# Patient Record
Sex: Female | Born: 1980 | Race: White | Hispanic: No | Smoking: Never smoker
Health system: Southern US, Community
[De-identification: ages and names within clinical notes are randomized; demographics above are authoritative.]

## PROBLEM LIST (undated history)

## (undated) HISTORY — PX: NO PAST SURGERIES: SHX2092

## (undated) HISTORY — PX: WISDOM TOOTH EXTRACTION: SHX21

---
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
## 2002-06-12 ENCOUNTER — Other Ambulatory Visit: Admission: RE | Admit: 2002-06-12 | Discharge: 2002-06-12 | Payer: Self-pay | Admitting: Internal Medicine

## 2003-10-12 ENCOUNTER — Other Ambulatory Visit: Admission: RE | Admit: 2003-10-12 | Discharge: 2003-10-12 | Payer: Self-pay | Admitting: Internal Medicine

## 2004-10-31 ENCOUNTER — Ambulatory Visit: Payer: Self-pay | Admitting: Internal Medicine

## 2004-10-31 ENCOUNTER — Other Ambulatory Visit: Admission: RE | Admit: 2004-10-31 | Discharge: 2004-10-31 | Payer: Self-pay | Admitting: Internal Medicine

## 2010-08-01 ENCOUNTER — Other Ambulatory Visit: Payer: Self-pay | Admitting: Obstetrics and Gynecology

## 2010-11-13 ENCOUNTER — Other Ambulatory Visit: Payer: Self-pay | Admitting: Family Medicine

## 2010-11-13 DIAGNOSIS — R1011 Right upper quadrant pain: Secondary | ICD-10-CM

## 2010-11-13 DIAGNOSIS — R1031 Right lower quadrant pain: Secondary | ICD-10-CM

## 2010-11-14 ENCOUNTER — Ambulatory Visit
Admission: RE | Admit: 2010-11-14 | Discharge: 2010-11-14 | Disposition: A | Discharge status: Home / Self Care | Payer: 59 | Source: Ambulatory Visit | Attending: Family Medicine | Admitting: Family Medicine

## 2010-11-14 DIAGNOSIS — R1031 Right lower quadrant pain: Secondary | ICD-10-CM

## 2010-11-14 DIAGNOSIS — R1011 Right upper quadrant pain: Secondary | ICD-10-CM

## 2010-11-14 MED ORDER — IOHEXOL 300 MG/ML  SOLN
100.0000 mL | Freq: Once | INTRAMUSCULAR | Status: AC | PRN
  Administered 2010-11-14: 100 mL via INTRAVENOUS

## 2011-01-22 LAB — OB RESULTS CONSOLE HEPATITIS B SURFACE ANTIGEN: Hepatitis B Surface Ag: NEGATIVE

## 2011-01-22 LAB — OB RESULTS CONSOLE RUBELLA ANTIBODY, IGM: Rubella: IMMUNE

## 2011-04-21 NOTE — L&D Delivery Note (Signed)
Delivery Note At 4:27 AM a viable female was delivered via Vaginal, Spontaneous Delivery (Presentation: Right Occiput Anterior).  APGAR: 8, 9; weight .   Placenta status: Intact, Spontaneous.  Cord: 3 vessels with the following complications: None.  Cord pH: not indicated  Anesthesia: Epidural  Episiotomy: None Lacerations: None;2nd degree;Perineal, b/l anterior splayed abrasions Suture Repair: 3.0 vicryl rapide 4.0 vicryl rapide used for a single figure of 8 to reapproximate splayed area anteriorly Est. Blood Loss (mL):   Mom to postpartum.  Baby to nursery-stable.  Rhyan Wolters A. 08/13/2011, 4:51 AM

## 2011-05-21 LAB — OB RESULTS CONSOLE ABO/RH: RH Type: NEGATIVE

## 2011-05-21 LAB — OB RESULTS CONSOLE GC/CHLAMYDIA: Gonorrhea: NEGATIVE

## 2011-07-15 LAB — OB RESULTS CONSOLE GBS: GBS: NEGATIVE

## 2011-08-12 ENCOUNTER — Inpatient Hospital Stay (HOSPITAL_COMMUNITY)
Admission: AD | Admit: 2011-08-12 | Discharge: 2011-08-15 | DRG: 775 | Disposition: A | Discharge status: Home / Self Care | Payer: 59 | Source: Ambulatory Visit | Attending: Obstetrics | Admitting: Obstetrics

## 2011-08-12 ENCOUNTER — Inpatient Hospital Stay (HOSPITAL_COMMUNITY): Payer: 59 | Admitting: Anesthesiology

## 2011-08-12 ENCOUNTER — Encounter (HOSPITAL_COMMUNITY): Payer: Self-pay | Admitting: *Deleted

## 2011-08-12 ENCOUNTER — Encounter (HOSPITAL_COMMUNITY): Payer: Self-pay | Admitting: Anesthesiology

## 2011-08-12 LAB — CBC
Hemoglobin: 13 g/dL (ref 12.0–15.0)
MCH: 31.2 pg (ref 26.0–34.0)
MCHC: 34.3 g/dL (ref 30.0–36.0)
RDW: 14.6 % (ref 11.5–15.5)

## 2011-08-12 MED ORDER — PHENYLEPHRINE 40 MCG/ML (10ML) SYRINGE FOR IV PUSH (FOR BLOOD PRESSURE SUPPORT)
80.0000 ug | PREFILLED_SYRINGE | INTRAVENOUS | Status: DC | PRN
  Filled 2011-08-12: qty 5

## 2011-08-12 MED ORDER — EPHEDRINE 5 MG/ML INJ
10.0000 mg | INTRAVENOUS | Status: DC | PRN
  Filled 2011-08-12: qty 4

## 2011-08-12 MED ORDER — LACTATED RINGERS IV SOLN
500.0000 mL | INTRAVENOUS | Status: DC | PRN
Start: 2011-08-12 — End: 2011-08-13

## 2011-08-12 MED ORDER — LACTATED RINGERS IV SOLN: 500.0000 mL | Freq: Once | INTRAVENOUS | Status: DC

## 2011-08-12 MED ORDER — OXYTOCIN BOLUS FROM INFUSION
500.0000 mL | Freq: Once | INTRAVENOUS | Status: DC
  Filled 2011-08-12: qty 1000
  Filled 2011-08-12: qty 500

## 2011-08-12 MED ORDER — LIDOCAINE HCL (PF) 1 % IJ SOLN
30.0000 mL | INTRAMUSCULAR | Status: DC | PRN
  Filled 2011-08-12: qty 30

## 2011-08-12 MED ORDER — PHENYLEPHRINE 40 MCG/ML (10ML) SYRINGE FOR IV PUSH (FOR BLOOD PRESSURE SUPPORT): 80.0000 ug | PREFILLED_SYRINGE | INTRAVENOUS | Status: DC | PRN

## 2011-08-12 MED ORDER — OXYCODONE-ACETAMINOPHEN 5-325 MG PO TABS: 1.0000 | ORAL_TABLET | ORAL | Status: DC | PRN

## 2011-08-12 MED ORDER — CITRIC ACID-SODIUM CITRATE 334-500 MG/5ML PO SOLN: 30.0000 mL | ORAL | Status: DC | PRN

## 2011-08-12 MED ORDER — ONDANSETRON HCL 4 MG/2ML IJ SOLN: 4.0000 mg | Freq: Four times a day (QID) | INTRAMUSCULAR | Status: DC | PRN

## 2011-08-12 MED ORDER — ACETAMINOPHEN 325 MG PO TABS: 650.0000 mg | ORAL_TABLET | ORAL | Status: DC | PRN

## 2011-08-12 MED ORDER — FLEET ENEMA 7-19 GM/118ML RE ENEM: 1.0000 | ENEMA | RECTAL | Status: DC | PRN

## 2011-08-12 MED ORDER — EPHEDRINE 5 MG/ML INJ: 10.0000 mg | INTRAVENOUS | Status: DC | PRN

## 2011-08-12 MED ORDER — DIPHENHYDRAMINE HCL 50 MG/ML IJ SOLN: 12.5000 mg | INTRAMUSCULAR | Status: DC | PRN

## 2011-08-12 MED ORDER — LACTATED RINGERS IV SOLN
INTRAVENOUS | Status: DC
  Administered 2011-08-12: 23:00:00 via INTRAVENOUS

## 2011-08-12 MED ORDER — OXYTOCIN 20 UNITS IN LACTATED RINGERS INFUSION - SIMPLE: 125.0000 mL/h | Freq: Once | INTRAVENOUS | Status: DC

## 2011-08-12 MED ORDER — IBUPROFEN 600 MG PO TABS
600.0000 mg | ORAL_TABLET | Freq: Four times a day (QID) | ORAL | Status: DC | PRN
  Administered 2011-08-13: 600 mg via ORAL

## 2011-08-12 MED ORDER — FENTANYL 2.5 MCG/ML BUPIVACAINE 1/10 % EPIDURAL INFUSION (WH - ANES)
14.0000 mL/h | INTRAMUSCULAR | Status: DC
  Administered 2011-08-13 (×2): 14 mL/h via EPIDURAL
  Filled 2011-08-12 (×2): qty 60

## 2011-08-12 NOTE — MAU Note (Signed)
Pt G1 at 39wks, leaking clear fluid since 1810 and having contractions.  Denies any problems with pregnancy.

## 2011-08-12 NOTE — Anesthesia Procedure Notes (Signed)
Epidural Patient location during procedure: OB Start time: 08/12/2011 11:51 PM Reason for block: procedure for pain  Staffing Performed by: anesthesiologist   Preanesthetic Checklist Completed: patient identified, site marked, surgical consent, pre-op evaluation, timeout performed, IV checked, risks and benefits discussed and monitors and equipment checked  Epidural Patient position: sitting Prep: site prepped and draped and DuraPrep Patient monitoring: continuous pulse ox and blood pressure Approach: midline Injection technique: LOR air  Needle:  Needle type: Tuohy  Needle gauge: 17 G Needle length: 9 cm Needle insertion depth: 4 cm Catheter type: closed end flexible Catheter size: 19 Gauge Catheter at skin depth: 9 cm Test dose: negative  Assessment Events: blood not aspirated, injection not painful, no injection resistance, negative IV test and no paresthesia  Additional Notes Discussed risk of headache, infection, bleeding, nerve injury and failed or incomplete block.  Patient voices understanding and wishes to proceed.

## 2011-08-12 NOTE — Anesthesia Preprocedure Evaluation (Signed)
Anesthesia Evaluation  Patient identified by MRN, date of birth, ID band Patient awake    Reviewed: Allergy & Precautions, H&P , NPO status , Patient's Chart, lab work & pertinent test results, reviewed documented beta blocker date and time   History of Anesthesia Complications Negative for: history of anesthetic complications  Airway Mallampati: I TM Distance: >3 FB Neck ROM: full    Dental  (+) Teeth Intact   Pulmonary neg pulmonary ROS,  breath sounds clear to auscultation        Cardiovascular negative cardio ROS  Rhythm:regular Rate:Normal     Neuro/Psych negative neurological ROS  negative psych ROS   GI/Hepatic negative GI ROS, Neg liver ROS,   Endo/Other  negative endocrine ROS  Renal/GU negative Renal ROS  negative genitourinary   Musculoskeletal   Abdominal   Peds  Hematology negative hematology ROS (+)   Anesthesia Other Findings   Reproductive/Obstetrics (+) Pregnancy                           Anesthesia Physical Anesthesia Plan  ASA: II  Anesthesia Plan: Epidural   Post-op Pain Management:    Induction:   Airway Management Planned:   Additional Equipment:   Intra-op Plan:   Post-operative Plan:   Informed Consent: I have reviewed the patients History and Physical, chart, labs and discussed the procedure including the risks, benefits and alternatives for the proposed anesthesia with the patient or authorized representative who has indicated his/her understanding and acceptance.     Plan Discussed with:   Anesthesia Plan Comments:         Anesthesia Quick Evaluation  

## 2011-08-13 ENCOUNTER — Encounter (HOSPITAL_COMMUNITY): Payer: Self-pay | Admitting: *Deleted

## 2011-08-13 LAB — ABO/RH: ABO/RH(D): O NEG

## 2011-08-13 LAB — RPR: RPR Ser Ql: NONREACTIVE

## 2011-08-13 MED ORDER — ZOLPIDEM TARTRATE 5 MG PO TABS: 5.0000 mg | ORAL_TABLET | Freq: Every evening | ORAL | Status: DC | PRN

## 2011-08-13 MED ORDER — PRENATAL MULTIVITAMIN CH
1.0000 | ORAL_TABLET | Freq: Every day | ORAL | Status: DC
  Administered 2011-08-13 – 2011-08-15 (×3): 1 via ORAL
  Filled 2011-08-13 (×3): qty 1

## 2011-08-13 MED ORDER — DIPHENHYDRAMINE HCL 25 MG PO CAPS
25.0000 mg | ORAL_CAPSULE | Freq: Four times a day (QID) | ORAL | Status: DC | PRN
Start: 2011-08-13 — End: 2011-08-15

## 2011-08-13 MED ORDER — ONDANSETRON HCL 4 MG PO TABS: 4.0000 mg | ORAL_TABLET | ORAL | Status: DC | PRN

## 2011-08-13 MED ORDER — BISACODYL 10 MG RE SUPP: 10.0000 mg | Freq: Every day | RECTAL | Status: DC | PRN

## 2011-08-13 MED ORDER — SIMETHICONE 80 MG PO CHEW: 80.0000 mg | CHEWABLE_TABLET | ORAL | Status: DC | PRN

## 2011-08-13 MED ORDER — FLEET ENEMA 7-19 GM/118ML RE ENEM: 1.0000 | ENEMA | Freq: Every day | RECTAL | Status: DC | PRN

## 2011-08-13 MED ORDER — LIDOCAINE HCL (PF) 1 % IJ SOLN
INTRAMUSCULAR | Status: DC | PRN
  Administered 2011-08-12 (×3): 4 mL

## 2011-08-13 MED ORDER — ONDANSETRON HCL 4 MG/2ML IJ SOLN: 4.0000 mg | INTRAMUSCULAR | Status: DC | PRN

## 2011-08-13 MED ORDER — SENNOSIDES-DOCUSATE SODIUM 8.6-50 MG PO TABS
2.0000 | ORAL_TABLET | Freq: Every day | ORAL | Status: DC
  Administered 2011-08-14: 2 via ORAL

## 2011-08-13 MED ORDER — WITCH HAZEL-GLYCERIN EX PADS: 1.0000 "application " | MEDICATED_PAD | CUTANEOUS | Status: DC | PRN

## 2011-08-13 MED ORDER — DIBUCAINE 1 % RE OINT: 1.0000 "application " | TOPICAL_OINTMENT | RECTAL | Status: DC | PRN

## 2011-08-13 MED ORDER — BENZOCAINE-MENTHOL 20-0.5 % EX AERO
1.0000 "application " | INHALATION_SPRAY | CUTANEOUS | Status: DC | PRN
  Administered 2011-08-13: 1 via TOPICAL
  Filled 2011-08-13 (×2): qty 56

## 2011-08-13 MED ORDER — LANOLIN HYDROUS EX OINT: TOPICAL_OINTMENT | CUTANEOUS | Status: DC | PRN

## 2011-08-13 MED ORDER — TETANUS-DIPHTH-ACELL PERTUSSIS 5-2.5-18.5 LF-MCG/0.5 IM SUSP
0.5000 mL | Freq: Once | INTRAMUSCULAR | Status: AC
  Administered 2011-08-14: 0.5 mL via INTRAMUSCULAR
  Filled 2011-08-13: qty 0.5

## 2011-08-13 MED ORDER — IBUPROFEN 600 MG PO TABS
600.0000 mg | ORAL_TABLET | Freq: Four times a day (QID) | ORAL | Status: DC
  Administered 2011-08-13 – 2011-08-15 (×9): 600 mg via ORAL
  Filled 2011-08-13 (×10): qty 1

## 2011-08-13 MED ORDER — OXYCODONE-ACETAMINOPHEN 5-325 MG PO TABS: 1.0000 | ORAL_TABLET | ORAL | Status: DC | PRN

## 2011-08-13 NOTE — Anesthesia Postprocedure Evaluation (Signed)
  Anesthesia Post-op Note  Patient: Brooke Dillon  Procedure(s) Performed: * No surgery found *  Patient Location: Mother/Baby  Anesthesia Type: Epidural  Level of Consciousness: awake  Airway and Oxygen Therapy: Patient Spontanous Breathing  Post-op Pain: none  Post-op Assessment: Patient's Cardiovascular Status Stable, Respiratory Function Stable, Patent Airway, No signs of Nausea or vomiting, Adequate PO intake, Pain level controlled, No headache, No backache, No residual numbness and No residual motor weakness  Post-op Vital Signs: Reviewed and stable  Complications: No apparent anesthesia complications

## 2011-08-13 NOTE — Progress Notes (Signed)
Patient ID: Brooke Dillon, female   DOB: 01-25-1981, 31 y.o.   MRN: [id number] PPD # 0  Subjective: Pt reports feeling well/ Pain controlled with prescription NSAID's including ibuprofen (Motrin) Tolerating po/ Voiding without problems/ No n/v Bleeding is moderate Newborn info:  Information for the patient's newborn:  Brooke Dillon [[id number]]  female  circ planned/ Feeding: breast   Objective:  VS: Blood pressure 109/70, pulse 98, temperature 99 F (37.2 C), temperature source Oral, resp. rate 18, height 5\' 2"  (1.575 m), weight 66.679 kg (147 lb), SpO2 100.00%, unknown if currently breastfeeding.    Basename 08/12/11 2241  WBC 14.6*  HGB 13.0  HCT 37.9  PLT 225    Blood type: --/--/O NEG (04/24 2241) Rubella: Immune (10/04 0000)    Physical Exam:  General: A & O x 3  alert, cooperative and no distress CV: Regular rate and rhythm Resp: clear Abdomen: soft, nontender, normal bowel sounds Uterine Fundus: firm, below umbilicus, nontender Perineum: with slight edema Lochia: moderate Ext: no edema, no evidence of a DVT   A/P: PPD # 1/ G1P1001/ S/P:spontaneous vaginal with 2nd degree laceration Doing well Continue routine post partum orders Anticipate D/C home in AM at pt request    [facility], [facility] 08/13/2011, 10:26 AM

## 2011-08-13 NOTE — H&P (Signed)
Brooke Dillon is a 31 y.o. G1P0 at [redacted]w[redacted]d presenting for SROM. Pt notes onset contractions about 1 hr after SROM . Good fetal movement, No vaginal bleeding other than spotting w/ wiping;  leaking fluid since 6p tonight.  [facility] at [facility] since 8 wks - early radiation exposure (CT abdomen in 1st trimester) - unplanned but desired pregnancy - low lying placenta w/ resolution  OB History    Grav Para Term Preterm Abortions TAB SAB Ect Mult Living   1              History reviewed. No pertinent past medical history. Past Surgical History  Procedure Date  . Wisdom tooth extraction   . No past surgeries    Family History: family history includes Hypertension in her mother.  There is no history of Anesthesia problems, and Hypotension, and Malignant hyperthermia, and Pseudochol deficiency, . Social History:  reports that she has never smoked. She has never used smokeless tobacco. She reports that she does not drink alcohol or use illicit drugs.  Review of Systems - Negative except contraction   Dilation: 4 Effacement (%): 80 Station: -1 Exam by:: H.Norton, RN  Blood pressure 123/76, pulse 112, temperature 98.4 F (36.9 C), temperature source Oral, resp. rate 18, height 5\' 2"  (1.575 m), weight 66.679 kg (147 lb), SpO2 100.00%.  Physical Exam: Gen: well appearing, no distress (after epidural) CV: RRR Pulm: CTAB Back: no CVAT Abd: gravid, NT, no RUQ pain, EFW 8# LE: no edema, equal bilaterally, non-tender Toco: q 3-4 min FH: baseline 140s, accelerations present, no deceleratons, 10 beat variability  Prenatal labs: ABO, Rh: O/Negative/-- (01/31 0000) Antibody: Negative (01/31 0000) Rubella:  immune RPR: Nonreactive (01/31 0000)  HBsAg: Negative (10/04 0000)  HIV: Non-reactive (01/31 0000)  GBS: Negative (03/27 0000)  1 hr Glucola nl  Genetic screening nl NT, nl AFP Anatomy US nl CF testing neg carrier status   Assessment/Plan: 31 y.o. G1P0 at [redacted]w[redacted]d ROM then onset of  active labor, good cervical change, cont expectant management Reactive fetal testing Epidural Rh neg, eval PP need for Rhogam   Dayten Juba A. 08/13/2011, 12:49 AM

## 2011-08-14 LAB — CBC
HCT: 35.2 % — ABNORMAL LOW (ref 36.0–46.0)
Hemoglobin: 11.6 g/dL — ABNORMAL LOW (ref 12.0–15.0)
MCHC: 33 g/dL (ref 30.0–36.0)
RBC: 3.74 MIL/uL — ABNORMAL LOW (ref 3.87–5.11)

## 2011-08-14 MED ORDER — RHO D IMMUNE GLOBULIN 1500 UNIT/2ML IJ SOLN
300.0000 ug | Freq: Once | INTRAMUSCULAR | Status: AC
  Administered 2011-08-14: 300 ug via INTRAMUSCULAR
  Filled 2011-08-14: qty 2

## 2011-08-14 NOTE — Progress Notes (Signed)
Patient ID: Brooke Dillon, female   DOB: Jul 10, 1980, 31 y.o.   MRN: [id number] PPD # 1  Subjective: Pt reports feeling well, but slightly sore perineal area/ Pain controlled with prescription NSAID's including ibuprofen (Motrin) Pt also concerned as the baby has not been breastfeeding very well-not ready to go home today as discussed yesterday Tolerating po/ Voiding without problems/ No n/v Bleeding is light Newborn info:  Information for the patient's newborn:  Brooke Dillon Boy Niomie [[id number]]  female circ planned/ Feeding: breast   Objective:  VS: Blood pressure 109/73, pulse 77, temperature 97.5 F (36.4 C), temperature source Oral, resp. rate 18, height 5\' 2"  (1.575 m), weight 66.679 kg (147 lb), SpO2 98.00%, unknown if currently breastfeeding.    Basename 08/14/11 0540 08/12/11 2241  WBC 12.0* 14.6*  HGB 11.6* 13.0  HCT 35.2* 37.9  PLT 192 225    Blood type: --/--/O NEG (04/26 0540) Rubella: Immune (10/04 0000)    Physical Exam:  General: A & O x 3  alert, cooperative and no distress CV: Regular rate and rhythm Resp: clear Abdomen: soft, nontender, normal bowel sounds Uterine Fundus: firm, below umbilicus(U/1), nontender  Perineum: some slight bruising noted in the right lower labial area Lochia: minimal Ext: extremities normal, atraumatic, no cyanosis or edema   A/P: PPD # 1/ G1P1001/ S/P:SVD with 2nd degree laceration Lactation already called this am for consultation Doing well Continue routine post partum orders Anticipate D/C home in AM    [facility] 08/14/2011, 8:29 AM

## 2011-08-15 LAB — RH IG WORKUP (INCLUDES ABO/RH)
ABO/RH(D): O NEG
Antibody Screen: NEGATIVE
Fetal Screen: NEGATIVE
Gestational Age(Wks): 39.1

## 2011-08-15 MED ORDER — IBUPROFEN 800 MG PO TABS: 800.0000 mg | ORAL_TABLET | Freq: Three times a day (TID) | ORAL | Status: DC | PRN

## 2011-08-15 NOTE — Progress Notes (Signed)
Patient ID: Gerardo Territo, female   DOB: 12/09/1980, 32 y.o.   MRN: [id number]  PPD 2 SVD  S:  Reports feeling well             Tolerating po/ No nausea or vomiting             Bleeding is light             Pain controlled with motrin only             Up ad lib / ambulatory  Newborn breast feeding     O:  A & O x 3 NAD             VS: Blood pressure 112/73, pulse 75, temperature 97.9 F (36.6 C), temperature source Oral, resp. rate 20, height 5\' 2"  (1.575 m), weight 66.679 kg (147 lb), SpO2 99.00%, unknown if currently breastfeeding.  Lungs: Clear and unlabored  Heart: regular rate and rhythm / no mumurs  Abdomen: soft, non-tender, non-distended              Fundus: firm, non-tender, U-1  Perineum: no edema  Lochia: light  Extremities: no edema, no calf pain or tenderness    A: PPD # 2   Doing well - stable status  P:  Routine post partum orders  Discharge home  Marlinda Mike 08/15/2011, 10:40 AM

## 2011-08-15 NOTE — Discharge Summary (Signed)
Obstetric Discharge Summary Reason for Admission: onset of labor Prenatal Procedures: none Intrapartum Procedures: spontaneous vaginal delivery Postpartum Procedures: none Complications-Operative and Postpartum: 2nd degree perineal laceration Hemoglobin  Date Value Range Status  08/14/2011 11.6* 12.0-15.0 (g/dL) Final     HCT  Date Value Range Status  08/14/2011 35.2* 36.0-46.0 (%) Final    Physical Exam:  General: alert, cooperative and no distress Lochia: appropriate Uterine Fundus: firm Incision: healing well DVT Evaluation: No evidence of DVT seen on physical exam.  Discharge Diagnoses: Term Pregnancy-delivered  Discharge Information: Date: 08/15/2011 Activity: pelvic rest Diet: routine Medications: PNV and Ibuprofen Condition: stable Instructions: refer to practice specific booklet Discharge to: home Follow-up Information    Schedule an appointment as soon as possible for a visit in 6 weeks to follow up.         Newborn Data: Live born female  Birth Weight: 7 lb 9.7 oz (3450 g) APGAR: 8, 9  Brooke Dillon 08/15/2011, 10:42 AM

## 2011-08-17 ENCOUNTER — Ambulatory Visit (HOSPITAL_COMMUNITY)
Admit: 2011-08-17 | Discharge: 2011-08-17 | Disposition: A | Discharge status: Home / Self Care | Payer: 59 | Attending: Obstetrics | Admitting: Obstetrics

## 2011-08-17 NOTE — Progress Notes (Signed)
Infant Lactation Consultation Outpatient Visit Note Reason for consult: difficult latch/using nipple shield  Patient Name: Brooke Dillon  Baby name: Aiden Amanda Cockayne Date of Birth: 08-Jul-1980             DOB: [birth year] Birth Weight:                                  7 lbs. 9 oz. Gestational Age at Delivery: Gestational Age: <None>  39 weeks Type of Delivery:  Vaginal Delivery  Breastfeeding History Frequency of Breastfeeding:  2-3 hrs Length of Feeding: 20 mins Voids: 6 Stools: 8 yellow seedy (changed 4/28)    Comments: Aiden weighs 7 lbs. 3 oz today at day 4.  This is up from 7 lbs. 1 oz on discharge 08/15/11.  Nipple shield initiated in the hospital due to difficult latch and semi flat nipples, and dense areolar tissue.  Milk began coming in yesterday at home, and Aiden began nursing more vigorously and swallowing heard, stools changed to yellow/seedy, from the dark meconium.  Baby appears healthy, alert, well hydrated with >6 wets and 8 yellow stools per day.    Mom latched Aiden easily in cross cradle hold.  Adjusted her hand placement to encourage a deeper latch.  Encouraged alternating breast massage during feedings to increase milk transfer.  Baby fed nutritive sucking for 20 mins on right, and >20 mins on left.  Left nipple with some old blistered noted.  Assisted [name] to try to latch Aiden without the nipple shield, but was unsuccessful.  Recommended she make an appointment later when she has had a little luck on her own at home.  PC weight gain from both breasts 50 ml.  Praised Mom and Dad for doing such a wonderful job with Aiden.  Recommended skin to skin, frequent breast feedings, and rest for the next few days.    Follow-up with pediatrician 08/19/11 weight check, and [facility] RN on 08/21/11.  To call our office prn.     [name] 08/17/2011, 11:09 AM

## 2011-08-23 ENCOUNTER — Inpatient Hospital Stay (HOSPITAL_COMMUNITY)
Admission: AD | Admit: 2011-08-23 | Discharge: 2011-08-23 | Disposition: A | Discharge status: Home / Self Care | Payer: 59 | Source: Ambulatory Visit | Attending: Obstetrics and Gynecology | Admitting: Obstetrics and Gynecology

## 2011-08-23 ENCOUNTER — Inpatient Hospital Stay (HOSPITAL_COMMUNITY): Payer: 59

## 2011-08-23 LAB — CBC
HCT: 38.5 % (ref 36.0–46.0)
Hemoglobin: 12.8 g/dL (ref 12.0–15.0)
MCH: 30.7 pg (ref 26.0–34.0)
MCHC: 33.2 g/dL (ref 30.0–36.0)
MCV: 92.3 fL (ref 78.0–100.0)
RDW: 13.5 % (ref 11.5–15.5)

## 2011-08-23 MED ORDER — METHYLERGONOVINE MALEATE 0.2 MG PO TABS: 0.2000 mg | ORAL_TABLET | Freq: Four times a day (QID) | ORAL | Status: AC

## 2011-08-23 MED ORDER — LACTATED RINGERS IV SOLN
Freq: Once | INTRAVENOUS | Status: AC
  Administered 2011-08-23: 04:00:00 via INTRAVENOUS

## 2011-08-23 MED ORDER — METHYLERGONOVINE MALEATE 0.2 MG/ML IJ SOLN
0.2000 mg | Freq: Once | INTRAMUSCULAR | Status: AC
  Administered 2011-08-23: 0.2 mg via INTRAMUSCULAR

## 2011-08-23 MED ORDER — METHYLERGONOVINE MALEATE 0.2 MG/ML IJ SOLN
INTRAMUSCULAR | Status: AC
  Filled 2011-08-23: qty 1

## 2011-08-23 NOTE — MAU Note (Signed)
Patient is in with c/o sudden onset of heavy vaginal bleeding with clots. The pad that she came in with was completed saturated and bled through her pants, medium size clot on underwear. pericare given, big blue pad given as well. She states that she had a normal vaginal delivery on 4/25 without any bleeding complications. She is c/o dizziness. Denies pain. She states that she took 800mg  ibuprophen about ago.

## 2011-08-23 NOTE — MAU Provider Note (Signed)
History     CSN: [id number]  Arrival date and time: 08/23/11 4098   First Provider Initiated Contact with Patient 08/23/11 0431      Chief Complaint  Patient presents with  . Vaginal Bleeding   HPI Comments: S/P uncomplicated SVB 4/25, G1P1001, currently breastfeeding.  Vaginal Bleeding The patient's primary symptoms include vaginal bleeding (started about 1 hour ago, reports brisk bleed, soaked through hre pad and clothes when coming in). This is a new problem. The current episode started today. The patient is experiencing no pain. She is not pregnant. Associated symptoms include nausea (earlier at home, denies at present). Pertinent negatives include no abdominal pain, chills or fever. She has been passing clots. She has not been passing tissue. She has tried NSAIDs (x1 800 mg at home prior to coming to hospital) for the symptoms. The treatment provided no relief.    OB History    Grav Para Term Preterm Abortions TAB SAB Ect Mult Living   1 1 1       1       Past Medical History  Diagnosis Date  . PP care - s/p NVB 4/25 08/13/2011    Past Surgical History  Procedure Date  . Wisdom tooth extraction   . No past surgeries     Family History  Problem Relation Age of Onset  . Hypertension Mother   . Anesthesia problems Neg Hx   . Hypotension Neg Hx   . Malignant hyperthermia Neg Hx   . Pseudochol deficiency Neg Hx     History  Substance Use Topics  . Smoking status: Never Smoker   . Smokeless tobacco: Never Used  . Alcohol Use: No    Allergies: No Known Allergies  Prescriptions prior to admission  Medication Sig Dispense Refill  . cetirizine (ZYRTEC) 10 MG tablet Take 10 mg by mouth daily.      Marland Kitchen ibuprofen (ADVIL,MOTRIN) 800 MG tablet Take 1 tablet (800 mg total) by mouth every 8 (eight) hours as needed for pain.  30 tablet  0  . Prenatal Vit-Fe Fumarate-FA (PRENATAL MULTIVITAMIN) TABS Take 1 tablet by mouth daily.        Review of Systems  Constitutional:  Negative for fever and chills.  Gastrointestinal: Positive for nausea (earlier at home, denies at present). Negative for abdominal pain.  Genitourinary: Positive for vaginal bleeding.   Physical Exam   Height 5\' 2"  (1.575 m), weight 61.746 kg (136 lb 2 oz), unknown if currently breastfeeding.  Physical Exam  Constitutional: She is oriented to person, place, and time. She appears well-developed and well-nourished.  Cardiovascular: Normal rate and regular rhythm.   Respiratory: Effort normal and breath sounds normal.  GI: Soft. There is no tenderness.  Genitourinary: Uterus is enlarged (c/w 1 week PP; vigorous fundal massage w/o further clots, small lochia at present, no clots presenting in LUS). Uterus is not tender.  Neurological: She is alert and oriented to person, place, and time.  Skin: Skin is warm and dry. No pallor.    MAU Course  Procedures STAT CBC pending IVF started with LR Methergine 0.2 mg IM given  Assessment and Plan  Delayed PP hemorrhage S/P SVD 1 week ago Hemodynamically stable  Sono for POC / clots pending  Hiliana Eilts 08/23/2011, 4:32 AM   CBC    Component Value Date/Time   WBC 6.9 08/23/2011 0414   RBC 4.17 08/23/2011 0414   HGB 12.8 08/23/2011 0414   HCT 38.5 08/23/2011 0414   PLT 294 08/23/2011  0414   MCV 92.3 08/23/2011 0414   MCH 30.7 08/23/2011 0414   MCHC 33.2 08/23/2011 0414   RDW 13.5 08/23/2011 0414    *RADIOLOGY REPORT*  Clinical Data: Heavy vaginal bleeding postpartum.  TRANSABDOMINAL AND TRANSVAGINAL ULTRASOUND OF PELVIS  Technique: Both transabdominal and transvaginal ultrasound  examinations of the pelvis were performed. Transabdominal technique  was performed for global imaging of the pelvis including uterus,  ovaries, adnexal regions, and pelvic cul-de-sac.  Comparison: CT of the abdomen and pelvis performed 11/14/2010  It was necessary to proceed with endovaginal exam following the  transabdominal exam to visualize the uterus and ovaries in  greater  detail.  Findings:  Uterus: Diffusely enlarged, with mild heterogeneity of the  visualized myometrium, reflecting the patient's postpartum state.  The uterus measures 12.1 x 7.4 x 8.9 cm.  Endometrium: Difficult to fully characterize, but appears grossly  unremarkable; it measures approximately 1.5 cm in thickness. No  abnormal focal blood flow is seen to suggest retained products of  conception.  Right ovary: Normal appearance/no adnexal mass; measures 2.4 x 1.9  x 2.0 cm.  Left ovary: Normal appearance/no adnexal mass; measures 3.0 x 1.7 x  1.8 cm.  Other findings: No free fluid seen within the pelvic cul-de-sac.  IMPRESSION:  No definite evidence for retained products of conception. The  endometrial echo complex is difficult to fully characterize but  appears grossly unremarkable.  Original Report Authenticated By: Tonia Ghent, M.D.  Fundus firm 2 FB above SP Lochia scant  Uterine subinvolution, no evidence of endometritis Will D/C home with Methergine 0.2 mg PO QID x 3 days F/U in office 1 week  Plan reviewed w/ Dr. Alesia Banda 08/23/2011 7:00 AM

## 2011-08-23 NOTE — Discharge Instructions (Signed)
Postpartum Hemorrhage  Postpartum hemorrhage is blood loss of more than 500 mL after childbirth. This is about two cups of blood. Most bleeding happens within 24 hours after birth. This is called primary postpartum hemorrhage. Less commonly, bleeding occurs more than 24 hours after birth, which is called secondary hemorrhage. The most common cause is uterine atony. This means your uterus does not shrink (contract) properly following delivery. When this happens, the blood vessels in the uterus are not squeezed and they keep on bleeding. This is serious.  SYMPTOMS   Bleeding after delivery is normal and you should expect vaginal bleeding. This means you will have bleeding coming from the birth canal for many days following childbirth. This is to be expected with both normal births and c-sections.   You are bleeding too much following your delivery if you are:   Passing large clots or pieces of tissue. This may be small pieces of placenta left after delivery.   Soaking more than one sanitary pad per hour for several hours.   Having heavy, bright-red bleeding which occurs four days or more after delivery.   Having a discharge which has a bad smell or if you begin to run an unexplained temperature over 101 F (38.3 C).   Having times of lightheadedness or fainting, feeling short of breath or having your heart beat fast with very little activity.  If you are having any of these symptoms, call your caregiver right away.  TREATMENT   Treatments used include medications and blood transfusions. If these treatments do not work, surgery may have to be done to remove the womb (uterus).   Sometimes bleeding occurs if portions of the afterbirth (placenta) are left behind in the uterus following delivery. If this happens, often a curettage or scraping of the inside of the uterus must be done. This usually stops the bleeding.   If bleeding is due to clotting or bleeding problems, which are not related to the pregnancy,  other treatments may be needed.  HOME CARE INSTRUCTIONS    Your caregiver may order bed rest (getting up to the bathroom only), or may allow you to continue light activity.   Keep track of the number of pads you use each day and how soaked (saturated) they are. Write this down.   Do no use tampons. Do not douche or have sexual intercourse until approved by your physician.   Rest and drink extra fluids.   Get proper amounts of rest and exercise.   If you are anemic following your pregnancy, be sure to take the iron and vitamin supplements that your caregiver recommends.   A diet rich in iron, such as spinach, red meat and legumes will be helpful.  SEEK IMMEDIATE MEDICAL CARE IF:   You experience severe cramps in your stomach, back or belly (abdomen).   You run an unexplained temperature of over 101 F (38.3 C) or as told by your caregiver.   You pass large clots or tissue. Save any tissue for your caregiver to look at.   Your bleeding increases or you become light-headed, weak, or faint.  Document Released: 06/27/2003 Document Revised: 03/26/2011 Document Reviewed: 01/20/2008  [employer] Patient Information 2012 [employer].

## 2011-08-23 NOTE — Progress Notes (Signed)
Colon Flattery cnm notified of patient c/o sudden heavy bleeding, bp. Order to start iv fluids, cbc, methergine 0.2mg  im and pelvic ultrasound.

## 2011-11-16 ENCOUNTER — Ambulatory Visit (HOSPITAL_COMMUNITY)
Admission: RE | Admit: 2011-11-16 | Discharge: 2011-11-16 | Disposition: A | Discharge status: Home / Self Care | Payer: 59 | Source: Ambulatory Visit | Attending: Obstetrics | Admitting: Obstetrics

## 2011-11-16 NOTE — Progress Notes (Signed)
Adult Lactation Consultation Outpatient Visit Note  Patient Name: Brooke Dillon Date of Birth: 12/31/1980 Gestational Age at Delivery: Unknown Type of Delivery:   Breastfeeding History: Frequency of Breastfeeding: Q 1 1/2-3 hours during the day. Wakes once at 2 am through night Length of Feeding: 30 min Voids: 6 Stools: 2  Supplementing / Method: Pumping:  Type of Pump: Has own Medela PIS   Frequency: once in am   Volume: 3 oz  Comments:    Consultation Evaluation:  Initial Feeding Assessment: Pre-feed Weight:12- 14.7 oz Post-feed Weight:12-15.9 oz   5894g Amount Transferred: 1.2 oz Comments: Baby was using NS until 2 Yezenia Fredrick ago. Latches well to breast now with no pain per Mom.Baby sound asleep when she they came in. Awakened. Baby last nursed 2 hours ago. Baby nursed here for 15 minutes. Nipple round, not pinched when baby came off the breast. Mom going back to work part time next week. Feels milk supply may have decreased some.  Additional Feeding Assessment: Pre-feed Weight:12- 15.9  5894g Post-feed Weight: 13-1.4   5936g Amount Transferred: 42g Comments:Baby nursed on second breast for 10 minutes with more swallows noted, then did some non nutritive sucking with few swallows noted. Baby off the breast- nipple round and baby seemed content.  Additional Feeding Assessment: Pre-feed Weight: Post-feed Weight: Amount Transferred: Comments:  Total Breast milk Transferred this Visit: 2.7 oz Total Supplement Given: 0  Additional Interventions: Baby gaining weight well and seems satisfied after feedings. Nurses more often during the day but sleeps well at night. Suggested pumping 2x/day to build up supply for back to work. Discussed Fenugreek but will wait and see if the extra pumping helps supply. Reminded to drink plenty of fluids No questions at present. To call prn.   Follow-Up  PRN    Brooke Dillon 11/16/2011, 1:55 PM

## 2013-04-20 NOTE — L&D Delivery Note (Signed)
Delivery Note At 5:56 PM a viable female was delivered via Vaginal, Spontaneous Delivery (Presentation: Right Occiput Anterior).  APGAR: , ; weight .   Placenta status: Intact, Spontaneous.  Cord:  3VC, short, with the following complications: none.  Cord pH: not indicated  Anesthesia:  epidural Episiotomy: none Lacerations: none Suture Repair: n/a Est. Blood Loss (mL):   Mom to postpartum.  Baby to Couplet care / Skin to Skin.  Leveda Kendrix A. 01/10/2014, 6:12 PM

## 2013-06-08 LAB — OB RESULTS CONSOLE GC/CHLAMYDIA
Chlamydia: NEGATIVE
Gonorrhea: NEGATIVE

## 2013-06-08 LAB — OB RESULTS CONSOLE HIV ANTIBODY (ROUTINE TESTING): HIV: NONREACTIVE

## 2013-10-19 LAB — OB RESULTS CONSOLE RPR: RPR: NONREACTIVE

## 2013-12-20 LAB — OB RESULTS CONSOLE GBS: GBS: NEGATIVE

## 2014-01-10 ENCOUNTER — Encounter (HOSPITAL_COMMUNITY): Payer: Self-pay | Admitting: *Deleted

## 2014-01-10 ENCOUNTER — Inpatient Hospital Stay (HOSPITAL_COMMUNITY)
Admission: AD | Admit: 2014-01-10 | Discharge: 2014-01-12 | DRG: 775 | Disposition: A | Discharge status: Home / Self Care | Payer: 59 | Source: Ambulatory Visit | Attending: Obstetrics | Admitting: Obstetrics

## 2014-01-10 ENCOUNTER — Inpatient Hospital Stay (HOSPITAL_COMMUNITY): Payer: 59 | Admitting: Anesthesiology

## 2014-01-10 ENCOUNTER — Encounter (HOSPITAL_COMMUNITY): Payer: 59 | Admitting: Anesthesiology

## 2014-01-10 DIAGNOSIS — Z3490 Encounter for supervision of normal pregnancy, unspecified, unspecified trimester: Secondary | ICD-10-CM

## 2014-01-10 DIAGNOSIS — K219 Gastro-esophageal reflux disease without esophagitis: Secondary | ICD-10-CM | POA: Diagnosis present

## 2014-01-10 DIAGNOSIS — Z8249 Family history of ischemic heart disease and other diseases of the circulatory system: Secondary | ICD-10-CM

## 2014-01-10 DIAGNOSIS — D509 Iron deficiency anemia, unspecified: Secondary | ICD-10-CM | POA: Diagnosis present

## 2014-01-10 DIAGNOSIS — O9902 Anemia complicating childbirth: Principal | ICD-10-CM | POA: Diagnosis present

## 2014-01-10 DIAGNOSIS — O360990 Maternal care for other rhesus isoimmunization, unspecified trimester, not applicable or unspecified: Secondary | ICD-10-CM | POA: Diagnosis present

## 2014-01-10 DIAGNOSIS — O479 False labor, unspecified: Secondary | ICD-10-CM | POA: Diagnosis present

## 2014-01-10 LAB — RPR

## 2014-01-10 LAB — CBC
HCT: 34.1 % — ABNORMAL LOW (ref 36.0–46.0)
HEMOGLOBIN: 11.7 g/dL — AB (ref 12.0–15.0)
MCH: 31.8 pg (ref 26.0–34.0)
MCHC: 34.3 g/dL (ref 30.0–36.0)
MCV: 92.7 fL (ref 78.0–100.0)
PLATELETS: 207 10*3/uL (ref 150–400)
RBC: 3.68 MIL/uL — ABNORMAL LOW (ref 3.87–5.11)
RDW: 14.6 % (ref 11.5–15.5)
WBC: 7.3 10*3/uL (ref 4.0–10.5)

## 2014-01-10 LAB — TYPE AND SCREEN
ABO/RH(D): O NEG
ANTIBODY SCREEN: NEGATIVE

## 2014-01-10 MED ORDER — SENNOSIDES-DOCUSATE SODIUM 8.6-50 MG PO TABS
2.0000 | ORAL_TABLET | ORAL | Status: DC
Start: 2014-01-11 — End: 2014-01-12
  Administered 2014-01-11: 2 via ORAL
  Filled 2014-01-10: qty 2

## 2014-01-10 MED ORDER — INFLUENZA VAC SPLIT QUAD 0.5 ML IM SUSY
0.5000 mL | PREFILLED_SYRINGE | INTRAMUSCULAR | Status: AC
  Administered 2014-01-11: 0.5 mL via INTRAMUSCULAR
  Filled 2014-01-10: qty 0.5

## 2014-01-10 MED ORDER — EPHEDRINE 5 MG/ML INJ
10.0000 mg | INTRAVENOUS | Status: DC | PRN
  Filled 2014-01-10: qty 2

## 2014-01-10 MED ORDER — OXYCODONE-ACETAMINOPHEN 5-325 MG PO TABS
1.0000 | ORAL_TABLET | ORAL | Status: DC | PRN
  Filled 2014-01-10: qty 1

## 2014-01-10 MED ORDER — OXYTOCIN BOLUS FROM INFUSION
500.0000 mL | INTRAVENOUS | Status: DC
  Administered 2014-01-10: 500 mL via INTRAVENOUS

## 2014-01-10 MED ORDER — OXYCODONE-ACETAMINOPHEN 5-325 MG PO TABS: 2.0000 | ORAL_TABLET | ORAL | Status: DC | PRN

## 2014-01-10 MED ORDER — PRENATAL MULTIVITAMIN CH
1.0000 | ORAL_TABLET | Freq: Every day | ORAL | Status: DC
  Administered 2014-01-11 – 2014-01-12 (×2): 1 via ORAL
  Filled 2014-01-10 (×2): qty 1

## 2014-01-10 MED ORDER — FENTANYL 2.5 MCG/ML BUPIVACAINE 1/10 % EPIDURAL INFUSION (WH - ANES)
INTRAMUSCULAR | Status: DC | PRN
  Administered 2014-01-10: 13 mL/h via EPIDURAL

## 2014-01-10 MED ORDER — OXYCODONE-ACETAMINOPHEN 5-325 MG PO TABS: 1.0000 | ORAL_TABLET | ORAL | Status: DC | PRN

## 2014-01-10 MED ORDER — OXYTOCIN 40 UNITS IN LACTATED RINGERS INFUSION - SIMPLE MED
62.5000 mL/h | INTRAVENOUS | Status: DC
  Filled 2014-01-10: qty 1000

## 2014-01-10 MED ORDER — FENTANYL 2.5 MCG/ML BUPIVACAINE 1/10 % EPIDURAL INFUSION (WH - ANES)
14.0000 mL/h | INTRAMUSCULAR | Status: DC | PRN
  Administered 2014-01-10: 13 mL/h via EPIDURAL

## 2014-01-10 MED ORDER — LACTATED RINGERS IV SOLN: 500.0000 mL | INTRAVENOUS | Status: DC | PRN

## 2014-01-10 MED ORDER — ONDANSETRON HCL 4 MG/2ML IJ SOLN: 4.0000 mg | INTRAMUSCULAR | Status: DC | PRN

## 2014-01-10 MED ORDER — LANOLIN HYDROUS EX OINT: TOPICAL_OINTMENT | CUTANEOUS | Status: DC | PRN

## 2014-01-10 MED ORDER — FENTANYL 2.5 MCG/ML BUPIVACAINE 1/10 % EPIDURAL INFUSION (WH - ANES)
INTRAMUSCULAR | Status: AC
  Administered 2014-01-10: 13 mL/h via EPIDURAL
  Filled 2014-01-10: qty 125

## 2014-01-10 MED ORDER — ONDANSETRON HCL 4 MG PO TABS: 4.0000 mg | ORAL_TABLET | ORAL | Status: DC | PRN

## 2014-01-10 MED ORDER — LIDOCAINE HCL (PF) 1 % IJ SOLN
30.0000 mL | INTRAMUSCULAR | Status: DC | PRN
  Filled 2014-01-10: qty 30

## 2014-01-10 MED ORDER — SIMETHICONE 80 MG PO CHEW: 80.0000 mg | CHEWABLE_TABLET | ORAL | Status: DC | PRN

## 2014-01-10 MED ORDER — CITRIC ACID-SODIUM CITRATE 334-500 MG/5ML PO SOLN: 30.0000 mL | ORAL | Status: DC | PRN

## 2014-01-10 MED ORDER — DIPHENHYDRAMINE HCL 25 MG PO CAPS: 25.0000 mg | ORAL_CAPSULE | Freq: Four times a day (QID) | ORAL | Status: DC | PRN

## 2014-01-10 MED ORDER — LACTATED RINGERS IV SOLN
INTRAVENOUS | Status: DC
  Administered 2014-01-10 (×2): via INTRAVENOUS

## 2014-01-10 MED ORDER — TETANUS-DIPHTH-ACELL PERTUSSIS 5-2.5-18.5 LF-MCG/0.5 IM SUSP: 0.5000 mL | Freq: Once | INTRAMUSCULAR | Status: DC

## 2014-01-10 MED ORDER — PHENYLEPHRINE 40 MCG/ML (10ML) SYRINGE FOR IV PUSH (FOR BLOOD PRESSURE SUPPORT)
80.0000 ug | PREFILLED_SYRINGE | INTRAVENOUS | Status: DC | PRN
  Filled 2014-01-10: qty 2

## 2014-01-10 MED ORDER — DIPHENHYDRAMINE HCL 50 MG/ML IJ SOLN: 12.5000 mg | INTRAMUSCULAR | Status: DC | PRN

## 2014-01-10 MED ORDER — DIBUCAINE 1 % RE OINT: 1.0000 "application " | TOPICAL_OINTMENT | RECTAL | Status: DC | PRN

## 2014-01-10 MED ORDER — FLEET ENEMA 7-19 GM/118ML RE ENEM: 1.0000 | ENEMA | Freq: Every day | RECTAL | Status: DC | PRN

## 2014-01-10 MED ORDER — ONDANSETRON HCL 4 MG/2ML IJ SOLN: 4.0000 mg | Freq: Four times a day (QID) | INTRAMUSCULAR | Status: DC | PRN

## 2014-01-10 MED ORDER — LACTATED RINGERS IV SOLN
500.0000 mL | Freq: Once | INTRAVENOUS | Status: AC
  Administered 2014-01-10: 500 mL via INTRAVENOUS

## 2014-01-10 MED ORDER — PHENYLEPHRINE 40 MCG/ML (10ML) SYRINGE FOR IV PUSH (FOR BLOOD PRESSURE SUPPORT)
PREFILLED_SYRINGE | INTRAVENOUS | Status: AC
  Filled 2014-01-10: qty 10

## 2014-01-10 MED ORDER — OXYTOCIN 10 UNIT/ML IJ SOLN: 10.0000 [IU] | Freq: Once | INTRAMUSCULAR | Status: DC

## 2014-01-10 MED ORDER — ZOLPIDEM TARTRATE 5 MG PO TABS: 5.0000 mg | ORAL_TABLET | Freq: Every evening | ORAL | Status: DC | PRN

## 2014-01-10 MED ORDER — BISACODYL 10 MG RE SUPP: 10.0000 mg | Freq: Every day | RECTAL | Status: DC | PRN

## 2014-01-10 MED ORDER — IBUPROFEN 600 MG PO TABS
600.0000 mg | ORAL_TABLET | Freq: Four times a day (QID) | ORAL | Status: DC
  Administered 2014-01-10 – 2014-01-12 (×7): 600 mg via ORAL
  Filled 2014-01-10 (×7): qty 1

## 2014-01-10 MED ORDER — BENZOCAINE-MENTHOL 20-0.5 % EX AERO: 1.0000 "application " | INHALATION_SPRAY | CUTANEOUS | Status: DC | PRN

## 2014-01-10 MED ORDER — LIDOCAINE HCL (PF) 1 % IJ SOLN
INTRAMUSCULAR | Status: DC | PRN
  Administered 2014-01-10 (×2): 4 mL

## 2014-01-10 MED ORDER — WITCH HAZEL-GLYCERIN EX PADS: 1.0000 "application " | MEDICATED_PAD | CUTANEOUS | Status: DC | PRN

## 2014-01-10 NOTE — H&P (Signed)
  OB ADMISSION/ HISTORY & PHYSICAL:  Admission Date: 01/10/2014 11:44 AM  Admit Diagnosis: 39.[redacted] weeks gestation  Brooke Dillon is a 33 y.o. female presenting for early labor.  Prenatal History: G2P1001   EDC: 01/11/14 by LMP, confirmed by Margo Aye  Prenatal care at [facility]  Primary Ob Provider: Dr. Ernestina Penna Prenatal course complicated by IDA and hx of PPH 1 wk after first SVD.  Prenatal Labs: ABO, Rh:   O Neg Antibody:  Neg Rubella:   Immune RPR:   NR HBsAg:   Neg HIV:   Neg GBS:   Neg 1 hr GTT : 103  Medical / Surgical History :  Past medical history:  Past Medical History  Diagnosis Date  . PP care - s/p NVB 4/25 08/13/2011     Past surgical history:  Past Surgical History  Procedure Laterality Date  . Wisdom tooth extraction    . No past surgeries      Family History:  Family History  Problem Relation Age of Onset  . Hypertension Mother   . Anesthesia problems Neg Hx   . Hypotension Neg Hx   . Malignant hyperthermia Neg Hx   . Pseudochol deficiency Neg Hx      Social History:  reports that she has never smoked. She has never used smokeless tobacco. She reports that she does not drink alcohol or use illicit drugs.  Allergies: Review of patient's allergies indicates no known allergies.   Current Medications at time of admission:  Prior to Admission medications   Medication Sig Start Date End Date Taking? Authorizing Provider  cetirizine (ZYRTEC) 10 MG tablet Take 10 mg by mouth daily.    Historical Provider, MD  ibuprofen (ADVIL,MOTRIN) 800 MG tablet Take 1 tablet (800 mg total) by mouth every 8 (eight) hours as needed for pain. 08/15/11   Marlinda Mike, CNM  Prenatal Vit-Fe Fumarate-FA (PRENATAL MULTIVITAMIN) TABS Take 1 tablet by mouth daily.    Historical Provider, MD    Review of Systems: +FM +irregular ctx No LOF No VB  Physical Exam:  VS: BP127/87, HR109, RR18, T97.7  General: alert and oriented, appears comfortable  Abdomen:  Gravid, soft and non-tender, non-distended / uterus: gravid, non-tender Extremities: No edema Genitalia / VE:  4/70/-2, vtx, AROM clear, moderate FHR: baseline rate 135 / variability mod / accelerations + / no decelerations TOCO: 2-6, mild  Assessment: 39.[redacted] weeks gestation First stage of labor, latent FHR category I   Plan:  Admit, AROM augmentation, EFM per policy, analgesia/anesthesia prn, anticipate SVD. Dr. Ernestina Penna notified of admission / plan of care and will follow care.   Donette Larry, N MSN, CNM 01/10/2014, 12:03 PM

## 2014-01-10 NOTE — Anesthesia Preprocedure Evaluation (Signed)
Anesthesia Evaluation  Patient identified by MRN, date of birth, ID band Patient awake    Reviewed: Allergy & Precautions, H&P , Patient's Chart, lab work & pertinent test results  Airway Mallampati: II TM Distance: >3 FB Neck ROM: Full    Dental no notable dental hx. (+) Teeth Intact   Pulmonary neg pulmonary ROS,  breath sounds clear to auscultation  Pulmonary exam normal       Cardiovascular negative cardio ROS  Rhythm:Regular Rate:Normal     Neuro/Psych negative neurological ROS  negative psych ROS   GI/Hepatic Neg liver ROS, GERD-  ,  Endo/Other  negative endocrine ROS  Renal/GU negative Renal ROS  negative genitourinary   Musculoskeletal negative musculoskeletal ROS (+)   Abdominal   Peds  Hematology  (+) anemia ,   Anesthesia Other Findings   Reproductive/Obstetrics (+) Pregnancy Hx/o PPH 1 week post partum last delivery                           Anesthesia Physical Anesthesia Plan  ASA: II  Anesthesia Plan: Epidural   Post-op Pain Management:    Induction:   Airway Management Planned:   Additional Equipment:   Intra-op Plan:   Post-operative Plan:   Informed Consent: I have reviewed the patients History and Physical, chart, labs and discussed the procedure including the risks, benefits and alternatives for the proposed anesthesia with the patient or authorized representative who has indicated his/her understanding and acceptance.     Plan Discussed with: Anesthesiologist  Anesthesia Plan Comments:         Anesthesia Quick Evaluation

## 2014-01-10 NOTE — Anesthesia Procedure Notes (Signed)
Epidural Patient location during procedure: OB Start time: 01/10/2014 4:51 PM  Staffing Anesthesiologist: Manali Mcelmurry A. Performed by: anesthesiologist   Preanesthetic Checklist Completed: patient identified, site marked, surgical consent, pre-op evaluation, timeout performed, IV checked, risks and benefits discussed and monitors and equipment checked  Epidural Patient position: sitting Prep: site prepped and draped and DuraPrep Patient monitoring: continuous pulse ox and blood pressure Approach: midline Location: L3-L4 Injection technique: LOR air  Needle:  Needle type: Tuohy  Needle gauge: 17 G Needle length: 9 cm and 9 Needle insertion depth: 5 cm cm Catheter type: closed end flexible Catheter size: 19 Gauge Catheter at skin depth: 10 cm Test dose: negative and Other  Assessment Events: blood not aspirated, injection not painful, no injection resistance, negative IV test and no paresthesia  Additional Notes Patient identified. Risks and benefits discussed including failed block, incomplete  Pain control, post dural puncture headache, nerve damage, paralysis, blood pressure Changes, nausea, vomiting, reactions to medications-both toxic and allergic and post Partum back pain. All questions were answered. Patient expressed understanding and wished to proceed. Sterile technique was used throughout procedure. Epidural site was Dressed with sterile barrier dressing. No paresthesias, signs of intravascular injection Or signs of intrathecal spread were encountered.  Patient was more comfortable after the epidural was dosed. Please see RN's note for documentation of vital signs and FHR which are stable.

## 2014-01-10 NOTE — H&P (Signed)
Pt seen and agree with above. GBS neg, NT neg, Rh neg. Prior SVD. H/o delayed PPH 1 wk post-delivery- watch closely. Expectant management now s/p AROM, if not entering active labor will start pitocin. Reactive fetal testing.

## 2014-01-11 ENCOUNTER — Encounter (HOSPITAL_COMMUNITY): Payer: Self-pay | Admitting: Obstetrics and Gynecology

## 2014-01-11 MED ORDER — RHO D IMMUNE GLOBULIN 1500 UNIT/2ML IJ SOSY
300.0000 ug | PREFILLED_SYRINGE | Freq: Once | INTRAMUSCULAR | Status: AC
  Administered 2014-01-11: 300 ug via INTRAVENOUS
  Filled 2014-01-11: qty 2

## 2014-01-11 NOTE — Anesthesia Postprocedure Evaluation (Signed)
  Anesthesia Post-op Note  Patient: Brooke Dillon  Procedure(s) Performed: * No procedures listed *  Patient Location: Mother/Baby  Anesthesia Type:Epidural  Level of Consciousness: awake, alert , oriented and patient cooperative  Airway and Oxygen Therapy: Patient Spontanous Breathing  Post-op Pain: mild  Post-op Assessment: Patient's Cardiovascular Status Stable, Respiratory Function Stable, No headache, No backache, No residual numbness and No residual motor weakness  Post-op Vital Signs: stable  Last Vitals:  Filed Vitals:   01/11/14 0044  BP: 107/64  Pulse: 93  Temp: 37.2 C  Resp: 18    Complications: No apparent anesthesia complications

## 2014-01-11 NOTE — Lactation Note (Signed)
This note was copied from the chart of Brooke Carleigh Buccieri. Lactation Consultation Note Experienced BF mom BF her 1st child for 1 yr. Used a NS for 2 months. Then BF on nipple w/o difficulty. Brought her own #24 NS, I fitted it and it was to large. I fitted mom #20 NS and was good. Gave mom shells to wear and hand pump to pre-pump to pull nipple out prior to putting on NS. Mom's nipple roll out well, to obtain deeper latch needs more everted nipple.  Patient Name: Brooke Dillon Mom encouraged to feed baby 8-12 times/24 hours and with feeding cues. Mom encouraged to feed baby 8-12 times/24 hours and with feeding cues. Reviewed Baby & Me book's Breastfeeding Basics. WH/LC brochure given w/resources, support groups and LC services.Referred to Baby and Me Book in Breastfeeding section Pg. 22-23 for position options and Proper latch demonstration.WH/LC brochure given w/resources, support groups and LC services. Today's Date: 01/11/2014 Reason for consult: Initial assessment   Maternal Data Has patient been taught Hand Expression?: Yes  Feeding    LATCH Score/Interventions                      Lactation Tools Discussed/Used Tools: Shells;Nipple Dorris Carnes;Pump Nipple shield size: 20 Shell Type: Inverted Breast pump type: Manual Initiated by:: Peri Jefferson RN Date initiated:: 01/11/14   Consult Status Consult Status: Follow-up Date: 01/12/14 Follow-up type: In-patient    Quinita Kostelecky, Diamond Nickel 01/11/2014, 7:30 AM

## 2014-01-11 NOTE — Progress Notes (Signed)
Patient ID: Brooke Dillon, female   DOB: 1980/08/04, 33 y.o.   MRN: [id number] PPD # 1 SVD  S:  Reports feeling well             Tolerating po/ No nausea or vomiting             Bleeding is light - felt 2 gushes while breastfeeding             Pain controlled with ibuprofen             Up ad lib / ambulatory / voiding without difficulties    Newborn  Information for the patient's newborn:  [name], Canner Girl Taleigh [[id number]]  female  breast feeding   O:  A & O x 3, in no apparent distress              VS:  Filed Vitals:   01/10/14 1903 01/10/14 1940 01/10/14 2040 01/11/14 0044  BP: 112/78 125/82 118/72 107/64  Pulse: 89 101 99 93  Temp:  98.4 F (36.9 C) 99 F (37.2 C) 98.9 F (37.2 C)  TempSrc:  Oral Oral Oral  Resp: Height:      Weight:      SpO2:        LABS:  Recent Labs  01/10/14 1238  WBC 7.3  HGB 11.7*  HCT 34.1*  PLT 207    Blood type: --/--/O NEG (09/23 1545)  Rubella:     I&O: I/O last 3 completed shifts: In: -  Out: 150 [Blood:150]             Lungs: Clear and unlabored  Heart: regular rate and rhythm / no murmurs  Abdomen: soft, non-tender, non-distended              Fundus: firm, non-tender, U-1  Perineum: intact - mild edema - ice pack in place  Lochia: minimal  Extremities: no edema, no calf pain or tenderness, no Homans    A/P: PPD # 1  33 y.o., [id number]   Principal Problem:    Postpartum care following vaginal delivery (9/23)    Doing well - stable status  Routine post partum orders  Anticipate discharge tomorrow    Raelyn Mora, M, MSN, CNM 01/11/2014, 9:16 AM

## 2014-01-12 LAB — RH IG WORKUP (INCLUDES ABO/RH)
ABO/RH(D): O NEG
FETAL SCREEN: NEGATIVE
Gestational Age(Wks): 39.6
UNIT DIVISION: 0

## 2014-01-12 MED ORDER — IBUPROFEN 600 MG PO TABS: 600.0000 mg | ORAL_TABLET | Freq: Four times a day (QID) | ORAL | Status: DC

## 2014-01-12 NOTE — Discharge Summary (Signed)
Obstetric Discharge Summary Reason for Admission: early labor Prenatal Course: anemia Intrapartum Procedures: spontaneous vaginal delivery Postpartum Procedures: Rho(D) Ig Complications-Operative and Postpartum: none Hemoglobin  Date Value Ref Range Status  01/10/2014 11.7* 12.0 - 15.0 g/dL Final     HCT  Date Value Ref Range Status  01/10/2014 34.1* 36.0 - 46.0 % Final    Physical Exam:  General: alert and cooperative Lochia: appropriate Uterine Fundus: firm DVT Evaluation: No evidence of DVT seen on physical exam. Negative Homan's sign. No cords or calf tenderness. No significant calf/ankle edema.  Discharge Diagnoses: Term Pregnancy-delivered  Discharge Information: Date: 01/12/2014 Activity: pelvic rest Diet: routine Medications: PNV, Ibuprofen and Iron Condition: stable Instructions: refer to practice specific booklet Discharge to: home Follow-up Information   Follow up with [facility] A., MD. Schedule an appointment as soon as possible for a visit in 6 weeks.   Specialty:  Obstetrics and Gynecology   Contact information:   [address] [place] [address] [phone]       Newborn Data: Live born female on 01/10/14 Birth Weight: 8 lb 2 oz (3685 g) APGAR: 9, 9  Home with mother.  Brooke Dillon, N 01/12/2014, 10:25 AM

## 2014-01-12 NOTE — Progress Notes (Signed)
PPD #2- SVD  Subjective:   Reports feeling well Tolerating po/ No nausea or vomiting Bleeding is light Pain controlled with Motrin Up ad lib / ambulatory / voiding without problems Newborn: breastfeeding   Objective:   VS: VS:  Filed Vitals:   01/11/14 0044 01/11/14 1000 01/11/14 1800 01/12/14 0557  BP: 107/64 110/73 115/79 96/57  Pulse: 93 82 90 87  Temp: 98.9 F (37.2 C) 97.9 F (36.6 C) 98.3 F (36.8 C) 97.8 F (36.6 C)  TempSrc: Oral Oral Oral Oral  Resp: Height:      Weight:      SpO2:  98%      LABS:  Recent Labs  01/10/14 1238  WBC 7.3  HGB 11.7*  PLT 207   Blood type: --/--/O NEG (09/24 0821) Rubella:                  I&O: Intake/Output     09/24 0701 - 09/25 0700 09/25 0701 - 09/26 0700   Blood     Total Output       Net              Physical Exam: Alert and oriented X3 Abdomen: soft, non-tender, non-distended  Fundus: firm, non-tender, U-1 Perineum: intact Lochia: small Extremities: No edema, no calf pain or tenderness    Assessment: PPD # 2 G2P2002/ S/P:induced vaginal Rh negative-Rhogam given Doing well - stable for discharge home   Plan: Discharge home RX's:  Ibuprofen  po Q 6 hrs prn pain #30 Refill x 0 Niferex  po QD  Routine pp visit in [employer] Ob/Gyn booklet given    Donette Larry, N MSN, CNM 01/12/2014, 8:39 AM

## 2014-01-22 ENCOUNTER — Ambulatory Visit (HOSPITAL_COMMUNITY): Payer: 59

## 2014-02-19 ENCOUNTER — Encounter (HOSPITAL_COMMUNITY): Payer: Self-pay | Admitting: Obstetrics and Gynecology

## 2018-01-25 ENCOUNTER — Other Ambulatory Visit: Payer: Self-pay | Admitting: Gastroenterology

## 2018-01-25 DIAGNOSIS — R7989 Other specified abnormal findings of blood chemistry: Secondary | ICD-10-CM

## 2018-01-25 DIAGNOSIS — R945 Abnormal results of liver function studies: Principal | ICD-10-CM

## 2018-02-15 ENCOUNTER — Ambulatory Visit (HOSPITAL_COMMUNITY): Payer: 59

## 2018-02-15 ENCOUNTER — Encounter (HOSPITAL_COMMUNITY): Payer: Self-pay

## 2018-03-01 ENCOUNTER — Other Ambulatory Visit: Payer: Self-pay | Admitting: Gastroenterology

## 2018-03-01 DIAGNOSIS — R945 Abnormal results of liver function studies: Principal | ICD-10-CM

## 2018-03-01 DIAGNOSIS — R7989 Other specified abnormal findings of blood chemistry: Secondary | ICD-10-CM

## 2019-05-09 ENCOUNTER — Ambulatory Visit: Payer: 59 | Attending: Internal Medicine

## 2019-05-09 DIAGNOSIS — Z20822 Contact with and (suspected) exposure to covid-19: Secondary | ICD-10-CM

## 2019-05-10 LAB — NOVEL CORONAVIRUS, NAA: SARS-CoV-2, NAA: NOT DETECTED

## 2020-09-09 ENCOUNTER — Other Ambulatory Visit: Payer: Self-pay | Admitting: Gastroenterology

## 2020-09-09 DIAGNOSIS — R7989 Other specified abnormal findings of blood chemistry: Secondary | ICD-10-CM

## 2020-09-27 ENCOUNTER — Ambulatory Visit
Admission: RE | Admit: 2020-09-27 | Discharge: 2020-09-27 | Disposition: A | Discharge status: Home / Self Care | Payer: 59 | Source: Ambulatory Visit | Attending: Gastroenterology | Admitting: Gastroenterology

## 2020-09-27 DIAGNOSIS — R7989 Other specified abnormal findings of blood chemistry: Secondary | ICD-10-CM

## 2020-10-01 ENCOUNTER — Other Ambulatory Visit: Payer: Self-pay | Admitting: Gastroenterology

## 2020-10-01 DIAGNOSIS — R160 Hepatomegaly, not elsewhere classified: Secondary | ICD-10-CM

## 2020-10-11 ENCOUNTER — Ambulatory Visit
Admission: RE | Admit: 2020-10-11 | Discharge: 2020-10-11 | Disposition: A | Discharge status: Home / Self Care | Payer: 59 | Source: Ambulatory Visit | Attending: Gastroenterology | Admitting: Gastroenterology

## 2020-10-11 DIAGNOSIS — R160 Hepatomegaly, not elsewhere classified: Secondary | ICD-10-CM

## 2020-10-11 MED ORDER — GADOBENATE DIMEGLUMINE 529 MG/ML IV SOLN
11.0000 mL | Freq: Once | INTRAVENOUS | Status: AC | PRN
  Administered 2020-10-11: 11 mL via INTRAVENOUS

## 2020-12-18 ENCOUNTER — Other Ambulatory Visit (HOSPITAL_COMMUNITY): Payer: Self-pay | Admitting: Gastroenterology

## 2020-12-18 ENCOUNTER — Other Ambulatory Visit: Payer: Self-pay | Admitting: Gastroenterology

## 2020-12-18 DIAGNOSIS — R7989 Other specified abnormal findings of blood chemistry: Secondary | ICD-10-CM

## 2020-12-30 ENCOUNTER — Ambulatory Visit (HOSPITAL_COMMUNITY): Admission: RE | Admit: 2020-12-30 | Payer: 59 | Source: Ambulatory Visit

## 2021-01-07 ENCOUNTER — Other Ambulatory Visit: Payer: Self-pay | Admitting: Radiology

## 2021-01-07 ENCOUNTER — Other Ambulatory Visit (HOSPITAL_COMMUNITY): Payer: Self-pay | Admitting: Physician Assistant

## 2021-01-08 ENCOUNTER — Ambulatory Visit (HOSPITAL_COMMUNITY): Admission: RE | Admit: 2021-01-08 | Payer: 59 | Source: Ambulatory Visit

## 2021-01-08 ENCOUNTER — Encounter (HOSPITAL_COMMUNITY): Payer: Self-pay

## 2021-01-17 ENCOUNTER — Other Ambulatory Visit (HOSPITAL_COMMUNITY): Payer: Self-pay | Admitting: Physician Assistant

## 2021-01-20 ENCOUNTER — Other Ambulatory Visit: Payer: Self-pay

## 2021-01-20 ENCOUNTER — Encounter (HOSPITAL_COMMUNITY): Payer: Self-pay | Admitting: *Deleted

## 2021-01-20 ENCOUNTER — Ambulatory Visit (HOSPITAL_COMMUNITY)
Admission: RE | Admit: 2021-01-20 | Discharge: 2021-01-20 | Disposition: A | Discharge status: Home / Self Care | Payer: 59 | Source: Ambulatory Visit | Attending: Gastroenterology | Admitting: Gastroenterology

## 2021-01-20 DIAGNOSIS — R7989 Other specified abnormal findings of blood chemistry: Secondary | ICD-10-CM | POA: Diagnosis not present

## 2021-01-20 DIAGNOSIS — K7589 Other specified inflammatory liver diseases: Secondary | ICD-10-CM | POA: Diagnosis not present

## 2021-01-20 LAB — CBC
HCT: 37.8 % (ref 36.0–46.0)
Hemoglobin: 12.4 g/dL (ref 12.0–15.0)
MCH: 29.8 pg (ref 26.0–34.0)
MCHC: 32.8 g/dL (ref 30.0–36.0)
MCV: 90.9 fL (ref 80.0–100.0)
Platelets: 213 10*3/uL (ref 150–400)
RBC: 4.16 MIL/uL (ref 3.87–5.11)
RDW: 12.5 % (ref 11.5–15.5)
WBC: 4.3 10*3/uL (ref 4.0–10.5)
nRBC: 0 % (ref 0.0–0.2)

## 2021-01-20 LAB — PREGNANCY, URINE: Preg Test, Ur: NEGATIVE

## 2021-01-20 LAB — PROTIME-INR
INR: 1.2 (ref 0.8–1.2)
Prothrombin Time: 14.9 seconds (ref 11.4–15.2)

## 2021-01-20 MED ORDER — FENTANYL CITRATE (PF) 100 MCG/2ML IJ SOLN
INTRAMUSCULAR | Status: AC
  Filled 2021-01-20: qty 2

## 2021-01-20 MED ORDER — SODIUM CHLORIDE 0.9 % IV SOLN: INTRAVENOUS | Status: DC

## 2021-01-20 MED ORDER — FENTANYL CITRATE (PF) 100 MCG/2ML IJ SOLN
INTRAMUSCULAR | Status: DC | PRN
  Administered 2021-01-20: 25 ug via INTRAVENOUS
  Administered 2021-01-20: 50 ug via INTRAVENOUS
  Administered 2021-01-20: 25 ug via INTRAVENOUS

## 2021-01-20 MED ORDER — MIDAZOLAM HCL 2 MG/2ML IJ SOLN
INTRAMUSCULAR | Status: DC | PRN
  Administered 2021-01-20 (×2): .5 mg via INTRAVENOUS
  Administered 2021-01-20: 1 mg via INTRAVENOUS

## 2021-01-20 MED ORDER — LIDOCAINE HCL (PF) 1 % IJ SOLN
INTRAMUSCULAR | Status: AC
  Filled 2021-01-20: qty 30

## 2021-01-20 MED ORDER — MIDAZOLAM HCL 2 MG/2ML IJ SOLN
INTRAMUSCULAR | Status: AC
  Filled 2021-01-20: qty 2

## 2021-01-20 MED ORDER — GELATIN ABSORBABLE 12-7 MM EX MISC
CUTANEOUS | Status: AC
  Filled 2021-01-20: qty 1

## 2021-01-20 NOTE — Sedation Documentation (Signed)
Report given to Carlean Jews RN.  Pt tolerated procedure very well, denies pain at this time, VSS.  Totals: 13 mins Fentanyl Versed 2mg 

## 2021-01-20 NOTE — Sedation Documentation (Signed)
Patient is resting comfortably. Procedure started °

## 2021-01-20 NOTE — H&P (Signed)
Chief Complaint: Patient was seen in consultation today for non-focal liver biopsy   Referring Physician(s): Mann,Jyothi  Supervising Physician: Marliss Coots  Patient Status: [facility] - Out-pt  History of Present Illness: Brooke Dillon is a 40 y.o. female with no significant past medical history. Recent lab work demonstrates elevated liver function values; recent imaging is pertinent for three lesions that appear to be hemangiomas.   Interventional Radiology has been asked to evaluate this patient for an image-guided non-focal liver biopsy for further work up.    Past Medical History:  Diagnosis Date   Postpartum care following vaginal delivery (9/23) 01/11/2014   PP care - s/p NVB 4/25 08/13/2011    Past Surgical History:  Procedure Laterality Date   NO PAST SURGERIES     WISDOM TOOTH EXTRACTION      Allergies: Patient has no known allergies.  Medications: Prior to Admission medications   Medication Sig Start Date End Date Taking? Authorizing Provider  sertraline (ZOLOFT) 100 MG tablet Take 100 mg by mouth daily. 09/24/20  Yes [provider]  ALPRAZolam Prudy Feeler) 0.25 MG tablet Take 0.25 mg by mouth daily as needed for anxiety. 08/20/20   [provider]     Family History  Problem Relation Age of Onset   Hypertension Mother    Anesthesia problems Neg Hx    Hypotension Neg Hx    Malignant hyperthermia Neg Hx    Pseudochol deficiency Neg Hx     Social History   Socioeconomic History   Marital status: Divorced    Spouse name: Not on file   Number of children: Not on file   Years of education: Not on file   Highest education level: Not on file  Occupational History   Not on file  Tobacco Use   Smoking status: Never   Smokeless tobacco: Never  Substance and Sexual Activity   Alcohol use: No   Drug use: No   Sexual activity: Yes    Birth control/protection: None  Other Topics Concern   Not on file  Social History Narrative   Not on file    Social Determinants of Health   Financial Resource Strain: Not on file  Food Insecurity: Not on file  Transportation Needs: Not on file  Physical Activity: Not on file  Stress: Not on file  Social Connections: Not on file    Review of Systems: A 12 point ROS discussed and pertinent positives are indicated in the HPI above.  All other systems are negative.  Review of Systems  Constitutional:  Negative for appetite change and fatigue.  Respiratory:  Negative for cough and shortness of breath.   Cardiovascular:  Negative for chest pain and leg swelling.  Gastrointestinal:  Negative for abdominal pain, diarrhea, nausea and vomiting.  Neurological:  Negative for dizziness and headaches.   Vital Signs: BP 118/71   Pulse 75   Temp 98.3 F (36.8 C) (Oral)   Resp 12   Ht 5\' 2"  (1.575 m)   Wt 130 lb (59 kg)   LMP 01/09/2021 (Exact Date)   SpO2 100%   BMI 23.78 kg/m   Physical Exam Constitutional:      General: She is not in acute distress.    Appearance: She is not ill-appearing.  HENT:     Mouth/Throat:     Mouth: Mucous membranes are moist.     Pharynx: Oropharynx is clear.  Pulmonary:     Effort: Pulmonary effort is normal.  Abdominal:  Tenderness: There is no abdominal tenderness.  Musculoskeletal:        General: Normal range of motion.  Skin:    General: Skin is warm and dry.     Coloration: Skin is not jaundiced.  Neurological:     Mental Status: She is alert and oriented to person, place, and time.    Imaging: No results found.  Labs:  CBC: Recent Labs    01/20/21 1209  WBC 4.3  HGB 12.4  HCT 37.8  PLT 213    COAGS: Recent Labs    01/20/21 1209  INR 1.2    BMP: No results for input(s): NA, K, CL, CO2, GLUCOSE, BUN, CALCIUM, CREATININE, GFRNONAA, GFRAA in the last 8760 hours.  Invalid input(s): CMP  LIVER FUNCTION TESTS: No results for input(s): BILITOT, AST, ALT, ALKPHOS, PROT, ALBUMIN in the last 8760 hours.  TUMOR MARKERS: No  results for input(s): AFPTM, CEA, CA199, CHROMGRNA in the last 8760 hours.  Assessment and Plan:  Elevated LFTs: Brooke Dillon, 40 year old female, presents today to the [facility] Interventional Radiology department for an image-guided non-focal liver biopsy.   Risks and benefits of a liver biopsy were discussed with the patient and/or patient's family including, but not limited to bleeding, infection, damage to adjacent structures or low yield requiring additional tests.  All of the questions were answered and there is agreement to proceed. She has been NPO.   Consent signed and in chart.  Thank you for this interesting consult.  I greatly enjoyed meeting Brooke Dillon and look forward to participating in their care.  A copy of this report was sent to the requesting provider on this date.  Electronically Signed: Alwyn Ren, AGACNP-BC [phone] 01/20/2021, 12:45 PM   I spent a total of  30 Minutes   in face to face in clinical consultation, greater than 50% of which was counseling/coordinating care for non-focal liver biopsy.

## 2021-01-20 NOTE — Progress Notes (Signed)
Discharge instructions reviewed with pt and her mother. Both voice understanding.

## 2021-01-20 NOTE — Sedation Documentation (Signed)
Biopsy taken.

## 2021-01-20 NOTE — Sedation Documentation (Signed)
Patient is resting comfortably. 

## 2021-01-20 NOTE — Procedures (Signed)
Interventional Radiology Procedure Note  Procedure: Ultrasound guided non-focal liver biopsy  Findings: Please refer to procedural dictation for full description. Left lobe, 18 ga x2.  Samples placed in formalin.  Gelfoam needle track embolization.  Complications: None immediate  Estimated Blood Loss: < 5 mL  Recommendations: 3 hours bedrest. Follow up biopsy results.   Marliss Coots, MD Pager: [phone]

## 2021-01-20 NOTE — Progress Notes (Signed)
Dr Elby Showers notified of nausea and vomiting and client states feels better now; no new orders and ok to d/c home

## 2021-01-24 LAB — SURGICAL PATHOLOGY

## 2023-05-18 IMAGING — US US BIOPSY CORE LIVER
1 series · 10 of 10 positions shown · non-contrast
Comparison: none

INDICATION: 40-year-old female with elevated liver function tests.

[Series 1: us biopsy (liver) · 10 of 10 slices shown]
[im 1/10]
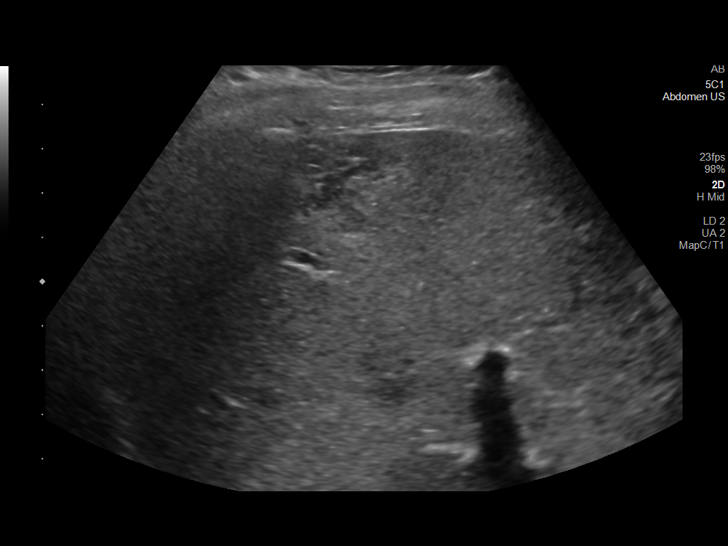
[im 2/10]
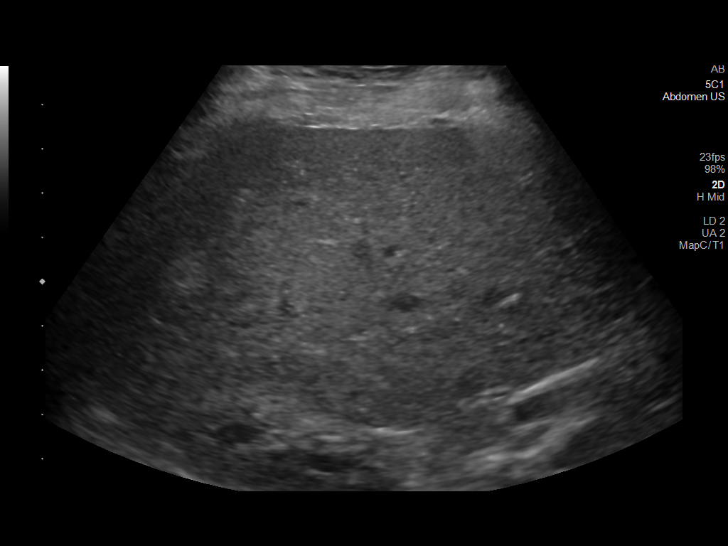
[im 3/10]
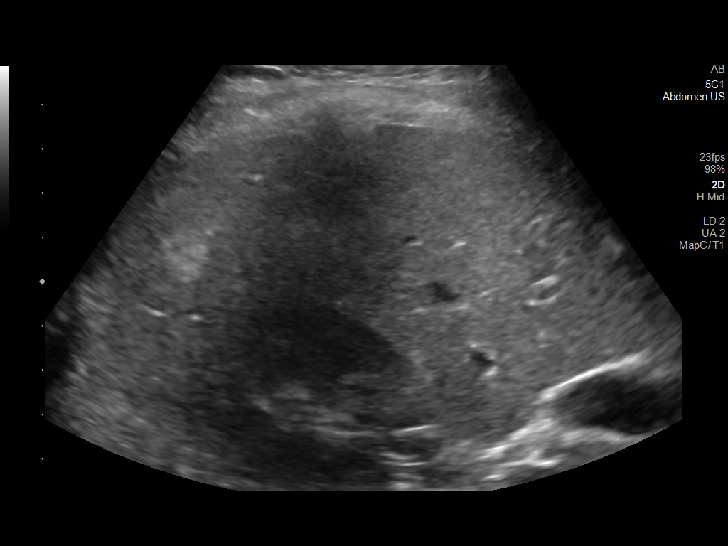
[im 4/10]
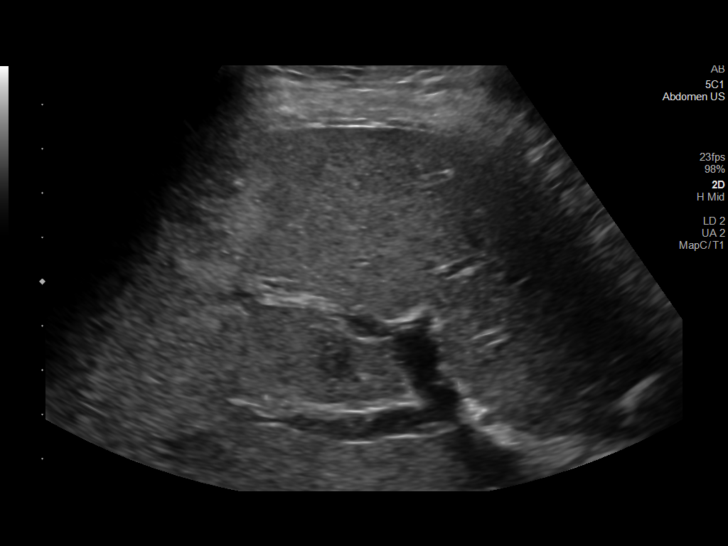
[im 5/10]
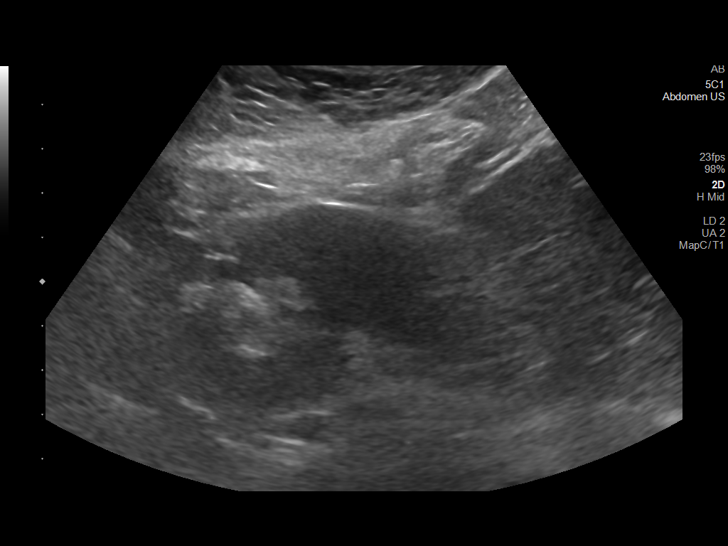
[im 6/10]
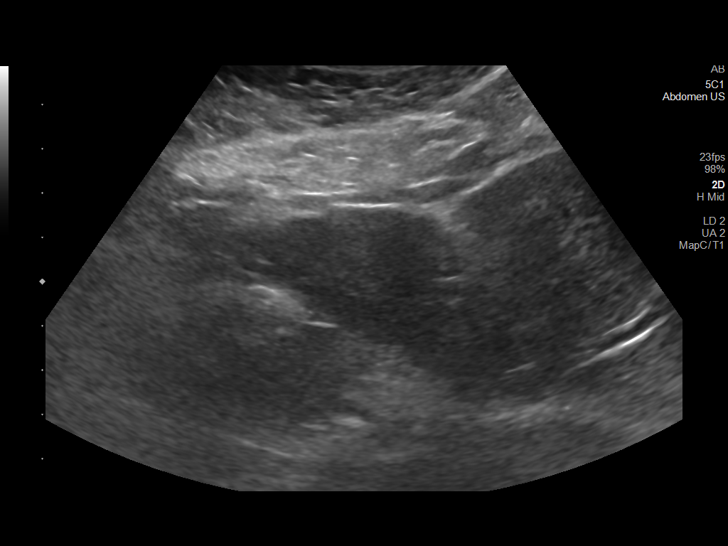
[im 7/10]
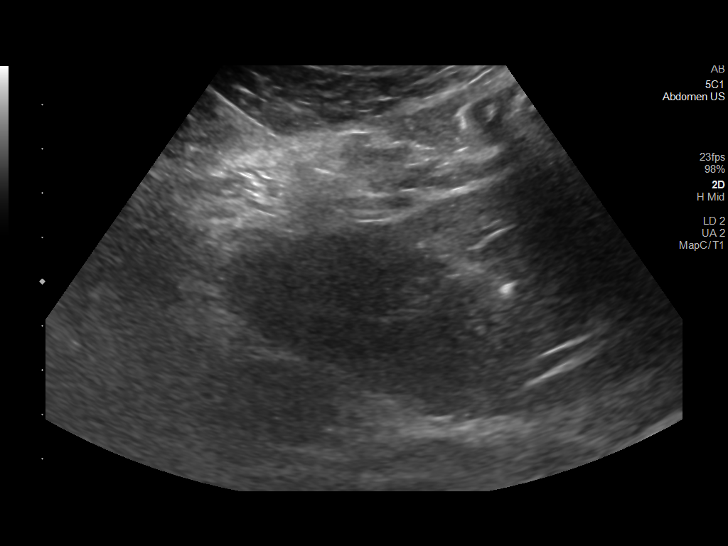
[im 8/10]
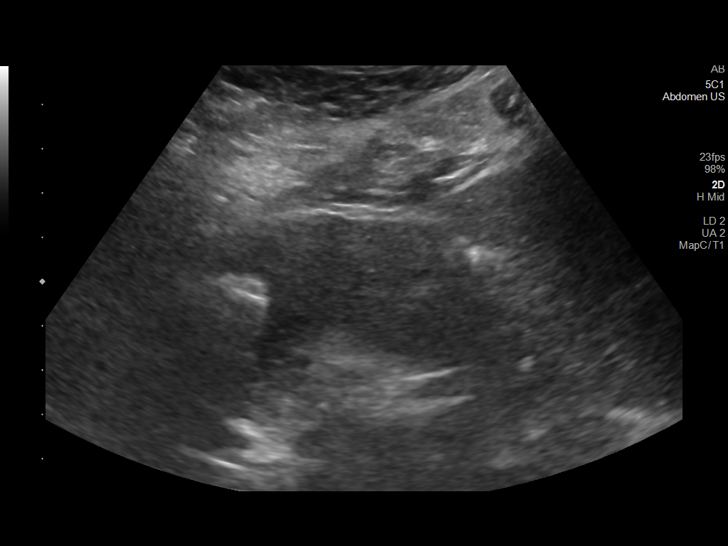
[im 9/10]
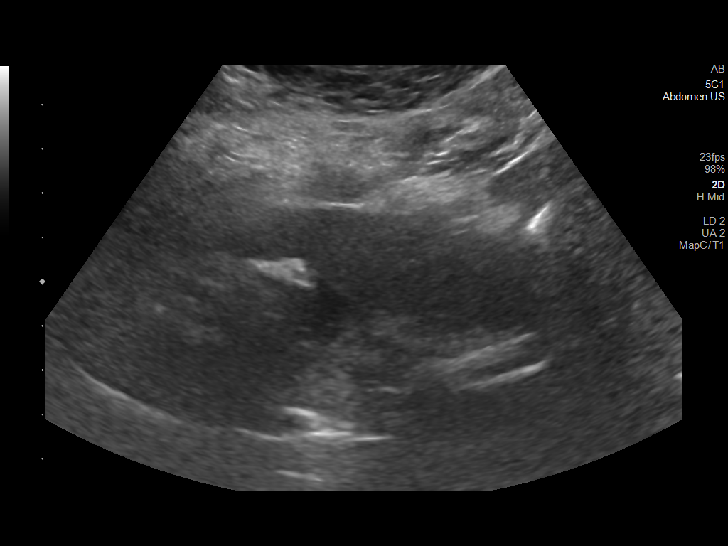
[im 10/10]
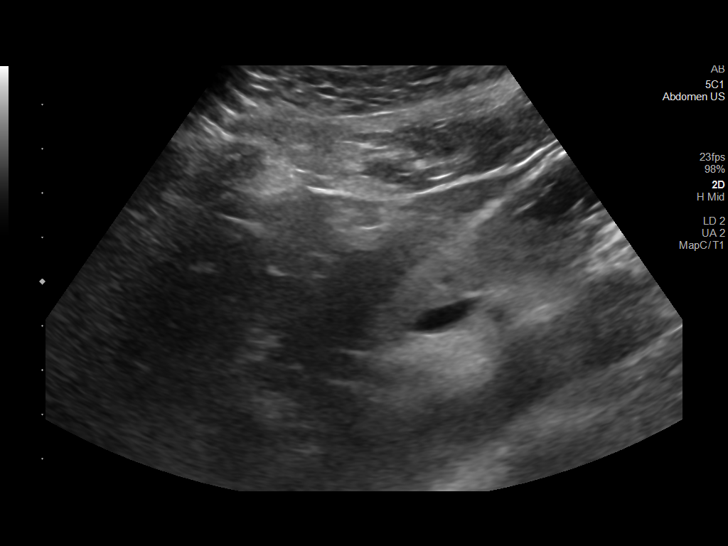

[10 of 10 positions shown; findings below may reference images not displayed]

EXAM:
ULTRASOUND BIOPSY CORE LIVER

MEDICATIONS:
None.

ANESTHESIA/SEDATION:
Moderate (conscious) sedation was employed during this procedure. A
total of Versed 2 mg and Fentanyl 100 mcg was administered
intravenously.

Moderate Sedation Time: 13 minutes. The patient's level of
consciousness and vital signs were monitored continuously by
radiology nursing throughout the procedure under my direct
supervision.

COMPLICATIONS:
None immediate.

PROCEDURE:
Informed written consent was obtained from the patient after a
thorough discussion of the procedural risks, benefits and
alternatives. All questions were addressed. Maximal Sterile Barrier
Technique was utilized including caps, mask, sterile gowns, sterile
gloves, sterile drape, hand hygiene and skin antiseptic. A timeout
was performed prior to the initiation of the procedure.

Preprocedure ultrasound demonstrated safe window in the subxiphoid
region of the left lobe of the liver for nonfocal liver biopsy. The
upper abdomen was prepped and draped in standard fashion. Local
anesthesia was administered subdermally at the planned entry site as
well as under ultrasound guidance along the hepatic capsule. A skin
nick was made. A 17 gauge introducer needle was advanced to the
hepatic parenchyma under ultrasound guidance. Next, a total of 2, 18
gauge core biopsies were obtained. The samples were placed in
formalin and sent to Pathology. Under ultrasound guidance, a
Gel-Foam slurry was administered along the needle entry tract as the
introducer needle was withdrawn. Postprocedure ultrasound
demonstrated no evidence of perihepatic fluid collection. The
patient tolerated the procedure well.
IMPRESSION: Technically successful ultrasound-guided nonfocal core liver biopsy
from the left lobe of the liver.
